# Patient Record
Sex: Female | Born: 1940
Health system: Southern US, Community
[De-identification: ages and names within clinical notes are randomized; demographics above are authoritative.]

## PROBLEM LIST (undated history)

## (undated) DIAGNOSIS — Z79890 Hormone replacement therapy (postmenopausal): Secondary | ICD-10-CM

## (undated) DIAGNOSIS — F419 Anxiety disorder, unspecified: Secondary | ICD-10-CM

## (undated) DIAGNOSIS — E785 Hyperlipidemia, unspecified: Secondary | ICD-10-CM

## (undated) DIAGNOSIS — M722 Plantar fascial fibromatosis: Secondary | ICD-10-CM

## (undated) DIAGNOSIS — I10 Essential (primary) hypertension: Secondary | ICD-10-CM

## (undated) HISTORY — PX: ABDOMINAL HYSTERECTOMY: SHX81

## (undated) HISTORY — PX: TONSILLECTOMY: SUR1361

## (undated) HISTORY — PX: CHOLECYSTECTOMY: SHX55

## (undated) HISTORY — PX: APPENDECTOMY: SHX54

## (undated) HISTORY — PX: KNEE SURGERY: SHX244

## (undated) HISTORY — PX: NECK SURGERY: SHX720

## (undated) HISTORY — PX: BACK SURGERY: SHX140

---

## 2013-11-29 ENCOUNTER — Emergency Department (HOSPITAL_BASED_OUTPATIENT_CLINIC_OR_DEPARTMENT_OTHER): Payer: Medicare Other

## 2013-11-29 ENCOUNTER — Encounter (HOSPITAL_BASED_OUTPATIENT_CLINIC_OR_DEPARTMENT_OTHER): Payer: Self-pay | Admitting: Emergency Medicine

## 2013-11-29 ENCOUNTER — Ambulatory Visit (HOSPITAL_BASED_OUTPATIENT_CLINIC_OR_DEPARTMENT_OTHER)
Admission: EM | Admit: 2013-11-29 | Discharge: 2013-11-29 | Disposition: A | Discharge status: Home / Self Care | Payer: Medicare Other | Attending: Emergency Medicine | Admitting: Emergency Medicine

## 2013-11-29 ENCOUNTER — Encounter: Payer: Self-pay | Admitting: Emergency Medicine

## 2013-11-29 ENCOUNTER — Emergency Department
Admission: EM | Admit: 2013-11-29 | Discharge: 2013-11-29 | Disposition: A | Discharge status: Still In Hospital | Payer: Medicare Other | Source: Home / Self Care | Attending: Emergency Medicine | Admitting: Emergency Medicine

## 2013-11-29 DIAGNOSIS — K5730 Diverticulosis of large intestine without perforation or abscess without bleeding: Secondary | ICD-10-CM | POA: Insufficient documentation

## 2013-11-29 DIAGNOSIS — R1031 Right lower quadrant pain: Secondary | ICD-10-CM | POA: Insufficient documentation

## 2013-11-29 DIAGNOSIS — R300 Dysuria: Secondary | ICD-10-CM

## 2013-11-29 DIAGNOSIS — R109 Unspecified abdominal pain: Secondary | ICD-10-CM

## 2013-11-29 DIAGNOSIS — I998 Other disorder of circulatory system: Secondary | ICD-10-CM | POA: Insufficient documentation

## 2013-11-29 DIAGNOSIS — I2510 Atherosclerotic heart disease of native coronary artery without angina pectoris: Secondary | ICD-10-CM | POA: Insufficient documentation

## 2013-11-29 DIAGNOSIS — R161 Splenomegaly, not elsewhere classified: Secondary | ICD-10-CM | POA: Insufficient documentation

## 2013-11-29 HISTORY — DX: Plantar fascial fibromatosis: M72.2

## 2013-11-29 HISTORY — DX: Hormone replacement therapy: Z79.890

## 2013-11-29 HISTORY — DX: Essential (primary) hypertension: I10

## 2013-11-29 HISTORY — DX: Hyperlipidemia, unspecified: E78.5

## 2013-11-29 HISTORY — DX: Anxiety disorder, unspecified: F41.9

## 2013-11-29 LAB — POCT URINALYSIS DIP (MANUAL ENTRY)
Bilirubin, UA: NEGATIVE
Glucose, UA: NEGATIVE
Ketones, POC UA: NEGATIVE
Nitrite, UA: NEGATIVE
Protein Ur, POC: NEGATIVE
Spec Grav, UA: 1.01 (ref 1.005–1.03)
Urobilinogen, UA: 0.2 (ref 0–1)
pH, UA: 5.5 (ref 5–8)

## 2013-11-29 MED ORDER — CIPROFLOXACIN HCL 500 MG PO TABS: 500.0000 mg | ORAL_TABLET | Freq: Two times a day (BID) | ORAL | Status: DC

## 2013-11-29 MED ORDER — METRONIDAZOLE 500 MG PO TABS: 500.0000 mg | ORAL_TABLET | Freq: Three times a day (TID) | ORAL | Status: DC

## 2013-11-29 MED ORDER — CEFTRIAXONE SODIUM 1 G IJ SOLR
1.0000 g | Freq: Once | INTRAMUSCULAR | Status: AC
  Administered 2013-11-29: 1 g via INTRAMUSCULAR

## 2013-11-29 NOTE — ED Notes (Signed)
Patient went to NP 8 days ago and was tested for UTI>negative and no meds. Today awoke with dysuria, hematuria and frequency. No OTCs.

## 2013-11-29 NOTE — ED Notes (Signed)
Right side abd pain x 1 week. Was seen at Urgent care today and sent here for CT scan and labs

## 2013-11-29 NOTE — ED Provider Notes (Addendum)
CSN: [id number]     Arrival date & time 11/29/13  1333 History   First MD Initiated Contact with Patient 11/29/13 1456     Chief Complaint  Patient presents with  . Dysuria  . Hematuria  . Urinary Frequency    HPI  DYSURIA Onset:  7-10 days  Description: lower abdominal pain, dysuria, hematuria  Modifying factors: Was seen for abd pain 1 week ago. Had negative UA at the time. KUB showed constipation. Feels like constipation has resolved. Hx/o abdominal surgeries in the past. Pain was 10/10 last night.   Symptoms Urgency:  no Frequency: no  Hesitancy:  yes Hematuria:  yes Flank Pain:  no Fever: no Nausea/Vomiting:  no Missed LMP: no STD exposure: no Discharge: no Irritants: no Rash: no  Red Flags   More than 3 UTI's last 12 months:  no PMH of  Diabetes or Immunosuppression:  no Renal Disease/Calculi: yes; remote past  Urinary Tract Abnormality:  no Instrumentation or Trauma: no    Past Medical History  Diagnosis Date  . Hypertension   . Hormone replacement therapy   . Hyperlipidemia   . Anxiety   . Plantar fascia syndrome     left heel   Past Surgical History  Procedure Laterality Date  . Appendectomy    . Tonsillectomy    . Cholecystectomy    . Abdominal hysterectomy    . Knee surgery Right   . Back surgery    . Neck surgery     No family history on file. History  Substance Use Topics  . Smoking status: Never Smoker   . Smokeless tobacco: Not on file  . Alcohol Use: Yes   OB History   Grav Para Term Preterm Abortions TAB SAB Ect Mult Living                 Review of Systems  All other systems reviewed and are negative.    Allergies  Review of patient's allergies indicates no known allergies.  Home Medications   Current Outpatient Rx  Name  Route  Sig  Dispense  Refill  . citalopram (CELEXA) 20 MG tablet   Oral   Take 20 mg by mouth daily.         Marland Kitchen estradiol (CLIMARA - DOSED IN MG/24 HR) 0.0375 mg/24hr patch   Transdermal  Place 0.0375 mg onto the skin once a week.         . ezetimibe (ZETIA) 10 MG tablet   Oral   Take 10 mg by mouth daily.         Marland Kitchen lisinopril (PRINIVIL,ZESTRIL) 20 MG tablet   Oral   Take 20 mg by mouth daily.          BP 127/71  Pulse 65  Temp(Src) 97.5 F (36.4 C) (Oral)  Resp 16  Ht 5\' 4"  (1.626 m)  Wt 174 lb (78.926 kg)  BMI 29.85 kg/m2  SpO2 100% Physical Exam  Constitutional: She appears well-developed and well-nourished.  HENT:  Head: Normocephalic and atraumatic.  Eyes: Conjunctivae are normal. Pupils are equal, round, and reactive to light.  Neck: Normal range of motion. Neck supple.  Cardiovascular: Normal rate and regular rhythm.   Pulmonary/Chest: Effort normal and breath sounds normal.  Abdominal: Soft.  + RLQ and LLQ TTP  + bowel sounds No flank pain      ED Course  Procedures (including critical care time) Labs Review Labs Reviewed  URINE CULTURE  COMPREHENSIVE METABOLIC PANEL  POCT URINALYSIS  DIP (MANUAL ENTRY)  POCT CBC W AUTO DIFF ([facility])   Imaging Review Ct Abdomen Pelvis Wo Contrast  11/29/2013   CLINICAL DATA:  Right lower quadrant abdominal pain and pelvic pain. Constipation. Hematuria. Surgical history includes appendectomy, cholecystectomy and hysterectomy.  EXAM: CT ABDOMEN AND PELVIS WITHOUT CONTRAST  TECHNIQUE: Multidetector CT imaging of the abdomen and pelvis was performed following the standard protocol without intravenous contrast.  COMPARISON:  Report of CT abdomen and pelvis from [facility] 03/17/2013, interpreted as no acute findings, mild colonic diverticulosis.  FINDINGS: Metallic beam hardening streak artifact from the patient's fusion hardware from T10 through S1.  Normal-appearing stomach. Wall thickening involving a several cm segment of the distal and terminal ileum without evidence of obstruction. No edema or inflammation surrounding the thick-walled distal and terminal ileum. Remainder of  the small bowel wall normal in appearance. Scattered sigmoid colon diverticula without evidence acute diverticulitis. Appendix surgically absent. No ascites.  Calcified granulomata involving the liver and spleen. Borderline splenomegaly, the spleen measuring approximately 11.9 x 5.6 x 12.8 cm yielding a volume of approximately 425 ml. Gallbladder surgically absent. No biliary ductal dilation. Normal unenhanced appearance of the pancreas, adrenal glands, and kidneys. Severe aortoiliofemoral atherosclerosis without aneurysm. No significant lymphadenopathy.  Uterus surgically absent. No adnexal masses or free pelvic fluid. Numerous pelvic phleboliths. Urinary bladder unremarkable.  Bone window images demonstrate the surgical hardware from T10 through S1. Visualized lung bases clear. Heart size normal with 3 vessel coronary atherosclerosis.  IMPRESSION: 1. Wall thickening involving a several cm segment of the distal and terminal ileum, consistent with ileitis. No evidence of bowel obstruction. In the absence of surrounding edema/inflammation, this may be a chronic finding. 2. Sigmoid colon diverticulosis without evidence of acute diverticulitis. 3. Borderline splenomegaly.   Electronically Signed   By: Hulan Saas M.D.   On: 11/29/2013 19:01    EKG Interpretation    Date/Time:    Ventricular Rate:    PR Interval:    QRS Duration:   QT Interval:    QTC Calculation:   R Axis:     Text Interpretation:              MDM   1. Abdominal  pain, other specified site   2. Dysuria    Fairly broad ddx for sxs including UTI, nephrolithiasis, bowel obstruction ( hx/o multiple abd surgeries), colonic pathology.  Will send pt for CT abd and pel w/ IV contrast to better assess anatomy CBC and CMET pending ( will have to obtain from imaging site).  Will treat for UTI w/ rocephin x1 pending CT scan.  Urine culture.  Overall well appearing currently.  Follow up pending CT scan.     Doree Albee,  MD 11/29/13 1600  Clinical update:  Noted ileitis, diverticulosis w/o diverticulitis, splenomegaly on imaging.  Discussed findings with pt over the phone.  Will place pt on course of cipro and flagyl.  Will need bloodwork drawn in am( was unable to be drawn at [facility] without pt being seen in ER-pt refused ER visit).  Discussed need for GI and PCP follow up early next week.  Discussed infectious and GI/GU red flags.  Follow up as needed.      Doree Albee, MD 11/29/13 [phone]

## 2013-11-29 NOTE — ED Notes (Addendum)
Pt sent from urgent care for CT scan with contrast.  Pt would need labwork for testing.  Radiology called ordering MD regarding pt.  Dr. Loralee Pacas called per RT and was told that the patient could have the CT without contrast and would not have to encounter an ED visit.

## 2013-11-30 ENCOUNTER — Emergency Department
Admission: EM | Admit: 2013-11-30 | Discharge: 2013-11-30 | Disposition: A | Discharge status: Home / Self Care | Payer: Medicare Other | Source: Home / Self Care

## 2013-11-30 ENCOUNTER — Telehealth: Payer: Self-pay | Admitting: Emergency Medicine

## 2013-11-30 LAB — COMPREHENSIVE METABOLIC PANEL
ALT: 8 U/L (ref 0–35)
Albumin: 3.8 g/dL (ref 3.5–5.2)
Alkaline Phosphatase: 55 U/L (ref 39–117)
CO2: 28 mEq/L (ref 19–32)
Creat: 1.33 mg/dL — ABNORMAL HIGH (ref 0.50–1.10)
Glucose, Bld: 152 mg/dL — ABNORMAL HIGH (ref 70–99)
Sodium: 140 mEq/L (ref 135–145)
Total Bilirubin: 0.4 mg/dL (ref 0.3–1.2)
Total Protein: 6.1 g/dL (ref 6.0–8.3)

## 2013-11-30 LAB — CBC
Hemoglobin: 11.5 g/dL — ABNORMAL LOW (ref 12.0–15.0)
MCH: 28 pg (ref 26.0–34.0)
MCHC: 34.5 g/dL (ref 30.0–36.0)
MCV: 81 fL (ref 78.0–100.0)
Platelets: 271 10*3/uL (ref 150–400)
RBC: 4.11 MIL/uL (ref 3.87–5.11)
RDW: 13.9 % (ref 11.5–15.5)

## 2013-12-01 LAB — URINE CULTURE
Colony Count: NO GROWTH
Organism ID, Bacteria: NO GROWTH

## 2013-12-04 ENCOUNTER — Telehealth: Payer: Self-pay | Admitting: *Deleted

## 2014-07-13 IMAGING — CT CT ABD-PELV W/O CM
2 of 4 series · 16 of 46 positions shown, 18 images · non-contrast
Comparison: Report of CT abdomen and pelvis from [REDACTED] 03/17/2013, interpreted as no acute findings, mild
colonic diverticulosis.

CLINICAL DATA: Right lower quadrant abdominal pain and pelvic pain.
Constipation. Hematuria. Surgical history includes appendectomy,
cholecystectomy and hysterectomy.

EXAM:
CT ABDOMEN AND PELVIS WITHOUT CONTRAST
TECHNIQUE: Multidetector CT imaging of the abdomen and pelvis was performed
following the standard protocol without intravenous contrast.

[Series 3: abd/pelvis 5.0 b31f · axial · 0.78mm/px · z∈[-482,-52]mm · 13 of 94 slices shown, 15 images]
[im 4/94  soft-tissue]
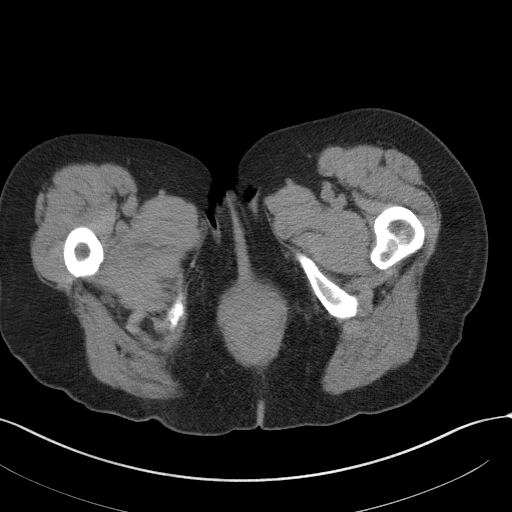
[im 4/94  bone]
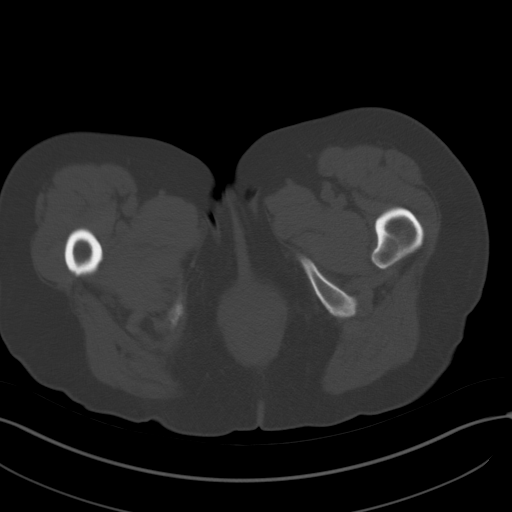
[im 12/94  soft-tissue]
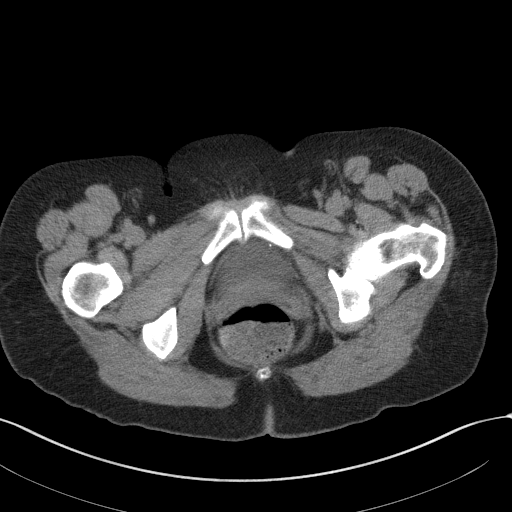
[im 19/94  soft-tissue]
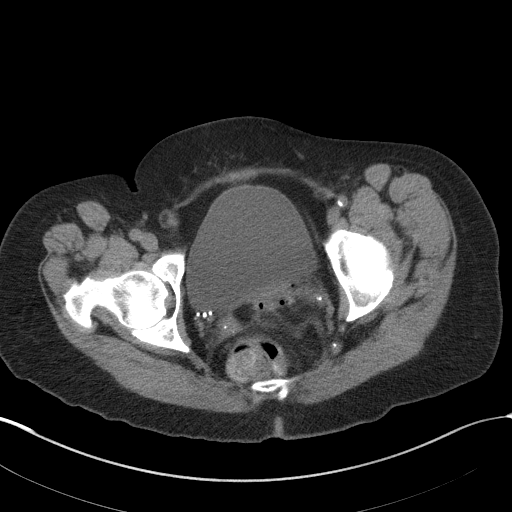
[im 27/94  soft-tissue]
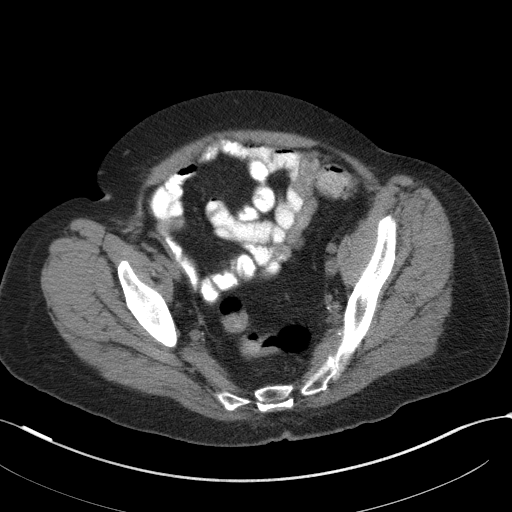
[im 34/94  soft-tissue]
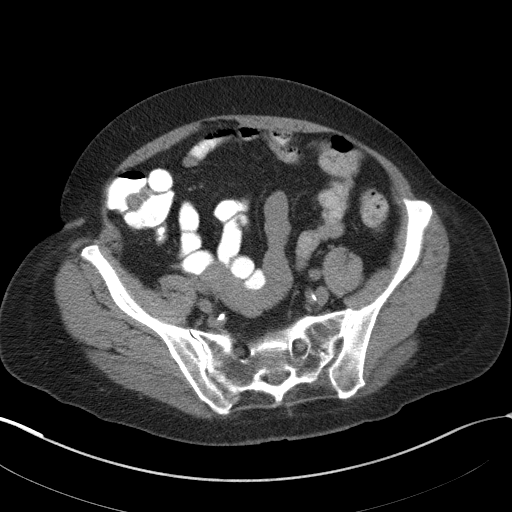
[im 41/94  soft-tissue]
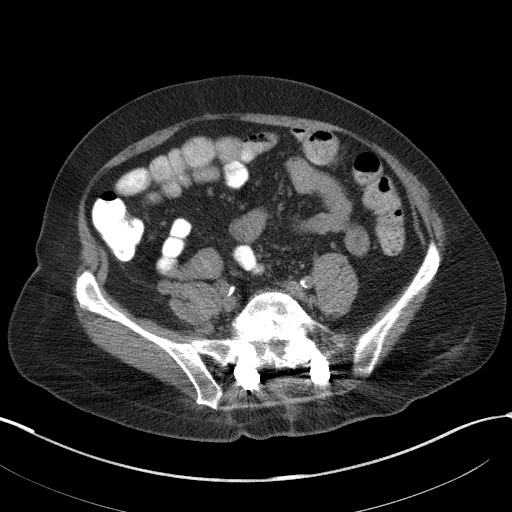
[im 49/94  soft-tissue]
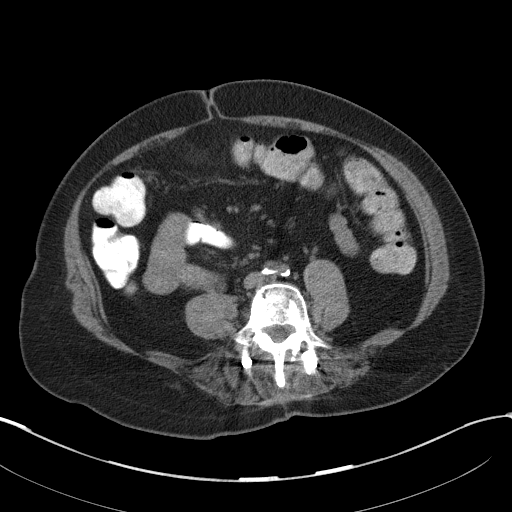
[im 53/94  soft-tissue]
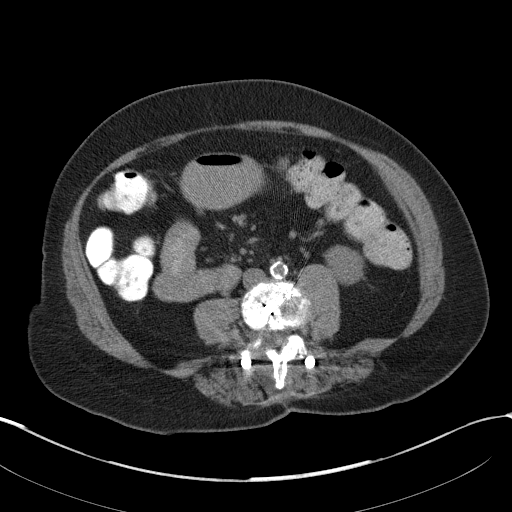
[im 60/94  soft-tissue]
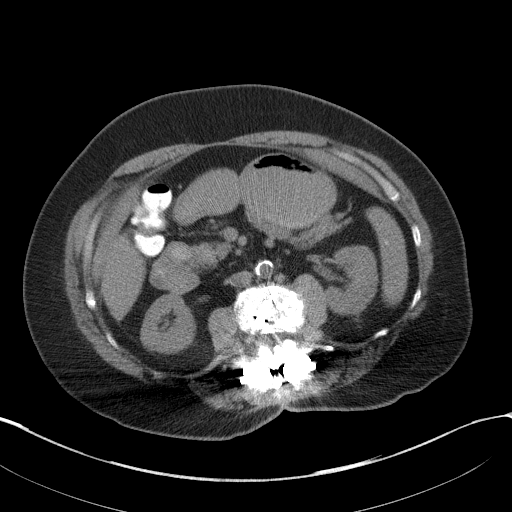
[im 60/94  bone]
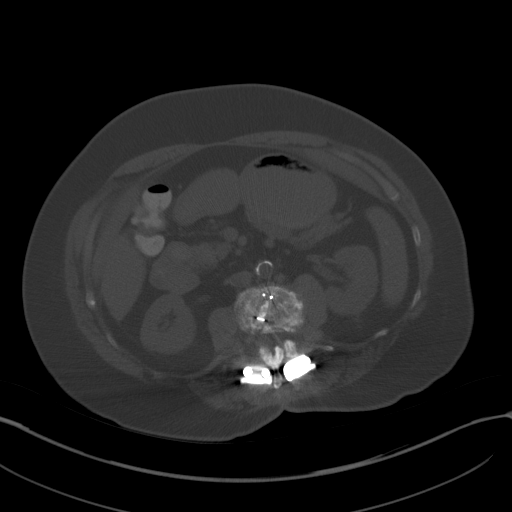
[im 67/94  soft-tissue]
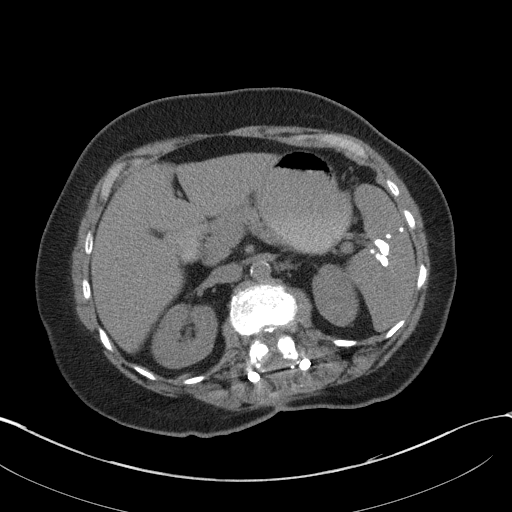
[im 75/94  soft-tissue]
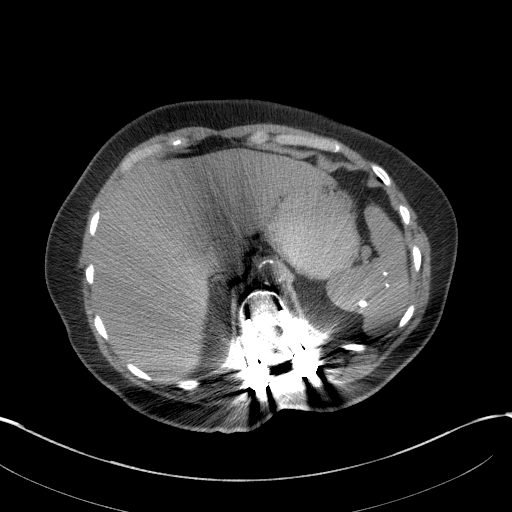
[im 82/94  soft-tissue]
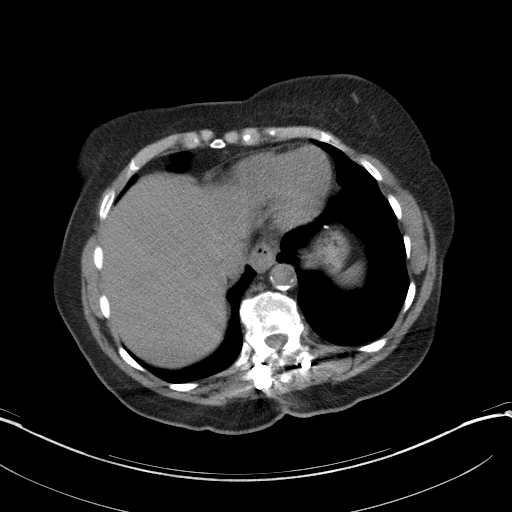
[im 90/94  soft-tissue]
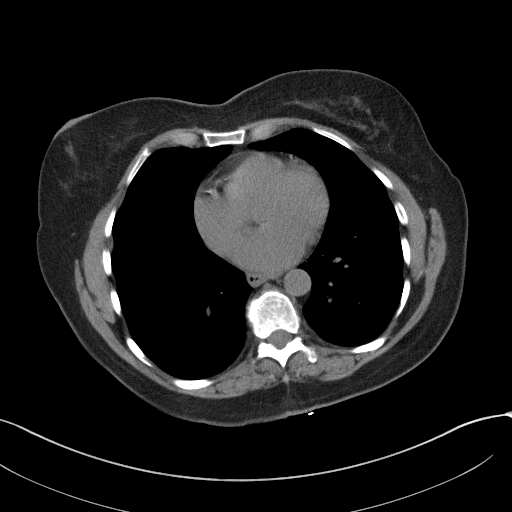

[Series 6: abd/pelvis 3.0 coronal · coronal · 0.81mm/px · 3 of 80 slices shown]
[im 27/80  soft-tissue]
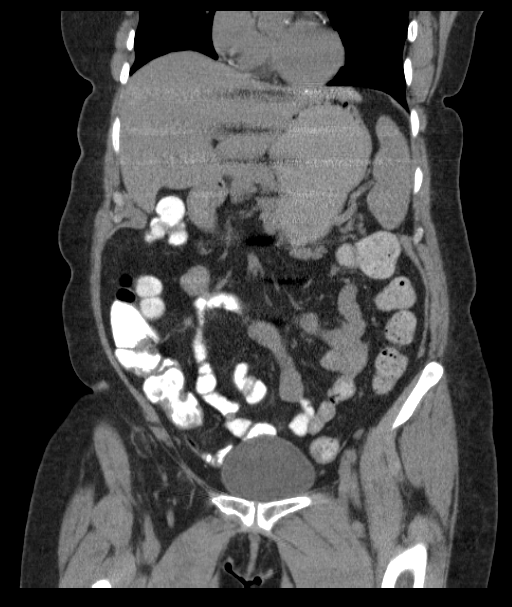
[im 36/80  soft-tissue]
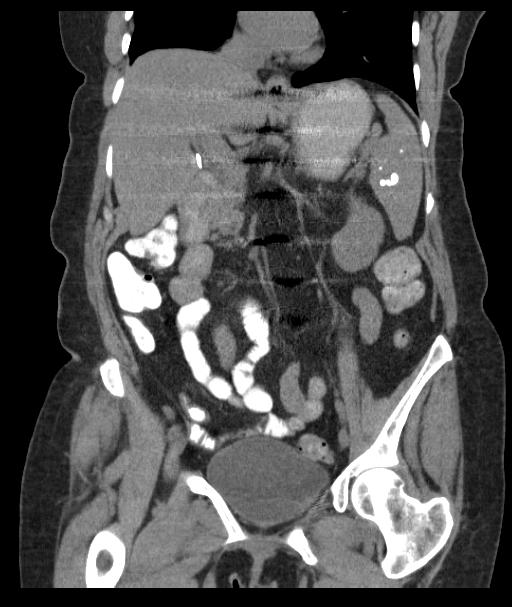
[im 44/80  soft-tissue]
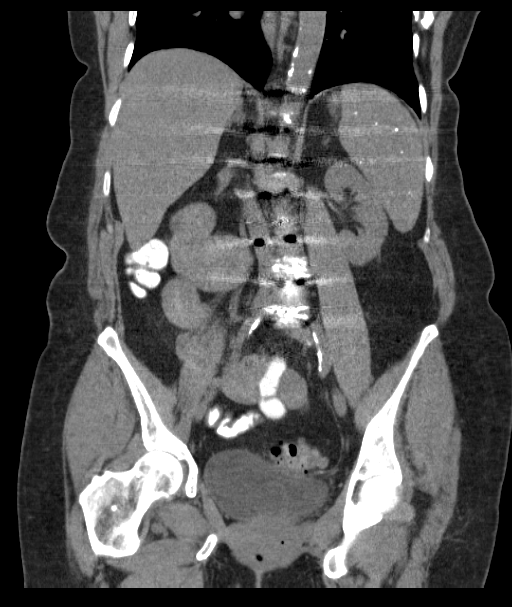

[16 of 46 positions shown; findings below may reference images not displayed]

FINDINGS: Metallic beam hardening streak artifact from the patient's fusion
hardware from T10 through S1.

Normal-appearing stomach. Wall thickening involving a several cm
segment of the distal and terminal ileum without evidence of
obstruction. No edema or inflammation surrounding the thick-walled
distal and terminal ileum. Remainder of the small bowel wall normal
in appearance. Scattered sigmoid colon diverticula without evidence
acute diverticulitis. Appendix surgically absent. No ascites.

Calcified granulomata involving the liver and spleen. Borderline
splenomegaly, the spleen measuring approximately 11.9 x 5.6 x
cm yielding a volume of approximately 425 ml. Gallbladder surgically
absent. No biliary ductal dilation. Normal unenhanced appearance of
the pancreas, adrenal glands, and kidneys. Severe aortoiliofemoral
atherosclerosis without aneurysm. No significant lymphadenopathy.

Uterus surgically absent. No adnexal masses or free pelvic fluid.
Numerous pelvic phleboliths. Urinary bladder unremarkable.

Bone window images demonstrate the surgical hardware from T10
through S1. Visualized lung bases clear. Heart size normal with 3
vessel coronary atherosclerosis.
IMPRESSION: 1. Wall thickening involving a several cm segment of the distal and
terminal ileum, consistent with ileitis. No evidence of bowel
obstruction. In the absence of surrounding edema/inflammation, this
may be a chronic finding.
2. Sigmoid colon diverticulosis without evidence of acute
diverticulitis.
3. Borderline splenomegaly.

## 2016-11-21 ENCOUNTER — Emergency Department
Admission: EM | Admit: 2016-11-21 | Discharge: 2016-11-21 | Disposition: A | Discharge status: Home / Self Care | Payer: Medicare HMO | Source: Home / Self Care | Attending: Family Medicine | Admitting: Family Medicine

## 2016-11-21 DIAGNOSIS — B9789 Other viral agents as the cause of diseases classified elsewhere: Secondary | ICD-10-CM | POA: Diagnosis not present

## 2016-11-21 DIAGNOSIS — J069 Acute upper respiratory infection, unspecified: Secondary | ICD-10-CM

## 2016-11-21 DIAGNOSIS — J9801 Acute bronchospasm: Secondary | ICD-10-CM

## 2016-11-21 MED ORDER — GUAIFENESIN-CODEINE 100-10 MG/5ML PO SOLN: ORAL | 0 refills | Status: DC

## 2016-11-21 MED ORDER — PREDNISONE 20 MG PO TABS: ORAL_TABLET | ORAL | 0 refills | Status: DC

## 2016-11-21 MED ORDER — METHYLPREDNISOLONE SODIUM SUCC 125 MG IJ SOLR
80.0000 mg | Freq: Once | INTRAMUSCULAR | Status: AC
  Administered 2016-11-21: 80 mg via INTRAMUSCULAR

## 2016-11-21 MED ORDER — ALBUTEROL SULFATE HFA 108 (90 BASE) MCG/ACT IN AERS: 2.0000 | INHALATION_SPRAY | RESPIRATORY_TRACT | 0 refills | Status: DC | PRN

## 2016-11-21 MED ORDER — CEPHALEXIN 500 MG PO CAPS: 500.0000 mg | ORAL_CAPSULE | Freq: Three times a day (TID) | ORAL | 0 refills | Status: DC

## 2016-11-21 NOTE — ED Provider Notes (Signed)
[facility]    CSN: [id number] Arrival date & time: 11/21/16  0900     History   Chief Complaint Chief Complaint  Patient presents with  . Cough  . Headache    HPI Sandra LoserSarah Newman is a 75 y.o. female.   Patient complains of four day history of typical cold-like symptoms developing over several days, including mild sore throat, sinus congestion, headache, fatigue, myalgias, and non-productive cough.  She has a history of seasonal rhinitis, and notes that URI's tend to linger.  She has a family history of asthma (sister and paternal grandfather).   The history is provided by the patient.    Past Medical History:  Diagnosis Date  . Anxiety   . Hormone replacement therapy   . Hyperlipidemia   . Hyperlipidemia   . Hypertension   . Plantar fascia syndrome    left heel    There are no active problems to display for this patient.   Past Surgical History:  Procedure Laterality Date  . ABDOMINAL HYSTERECTOMY    . APPENDECTOMY    . BACK SURGERY    . CHOLECYSTECTOMY    . KNEE SURGERY Right   . NECK SURGERY    . TONSILLECTOMY      OB History    No data available       Home Medications    Prior to Admission medications   Medication Sig Start Date End Date Taking? Authorizing Provider  albuterol (PROVENTIL HFA;VENTOLIN HFA) 108 (90 Base) MCG/ACT inhaler Inhale 2 puffs into the lungs every 4 (four) hours as needed for wheezing or shortness of breath. 11/21/16   Lattie HawStephen A Beese, MD  cephALEXin (KEFLEX) 500 MG capsule Take 1 capsule (500 mg total) by mouth 3 (three) times daily. (Rx void after 11/29/16) 11/21/16   Lattie HawStephen A Beese, MD  ciprofloxacin (CIPRO) 500 MG tablet Take 1 tablet (500 mg total) by mouth 2 (two) times daily. 11/29/13   Floydene FlockSteven J Newton, MD  citalopram (CELEXA) 20 MG tablet Take 20 mg by mouth daily.    Historical Provider, MD  estradiol (CLIMARA - DOSED IN MG/24 HR) 0.0375 mg/24hr patch Place 0.0375 mg onto the skin once a week.     Historical Provider, MD  ezetimibe (ZETIA) 10 MG tablet Take 10 mg by mouth daily.    Historical Provider, MD  guaiFENesin-codeine 100-10 MG/5ML syrup Take 10mL by mouth at bedtime as needed for cough 11/21/16   Lattie HawStephen A Beese, MD  lisinopril (PRINIVIL,ZESTRIL) 20 MG tablet Take 20 mg by mouth daily.    Historical Provider, MD  metroNIDAZOLE (FLAGYL) 500 MG tablet Take 1 tablet (500 mg total) by mouth 3 (three) times daily. 11/29/13   Floydene FlockSteven J Newton, MD  predniSONE (DELTASONE) 20 MG tablet Take one tab by mouth twice daily for 5 days, then one daily for 3 days. Take with food. 11/21/16   Lattie HawStephen A Beese, MD    Family History Family History  Problem Relation Age of Onset  . Cancer Mother   . Hypertension Father     Social History Social History  Substance Use Topics  . Smoking status: Never Smoker  . Smokeless tobacco: Never Used  . Alcohol use Yes     Comment: seldom     Allergies   Patient has no known allergies.   Review of Systems Review of Systems  + sore throat + cough No pleuritic pain No wheezing + nasal congestion + post-nasal drainage No sinus pain/pressure No itchy/red eyes No  earache No hemoptysis + SOB No fever, + chills No nausea No vomiting No abdominal pain + diarrhea, resolved No urinary symptoms No skin rash + fatigue + myalgias + headache Used OTC meds without relief    Physical Exam Triage Vital Signs ED Triage Vitals  Enc Vitals Group     BP 11/21/16 0922 153/81     Pulse Rate 11/21/16 0922 100     Resp --      Temp 11/21/16 0922 97.8 F (36.6 C)     Temp Source 11/21/16 0922 Oral     SpO2 11/21/16 0922 93 %     Weight 11/21/16 0922 174 lb (78.9 kg)     Height 11/21/16 0922 5\' 5"  (1.651 m)     Head Circumference --      Peak Flow --      Pain Score 11/21/16 0925 4     Pain Loc --      Pain Edu? --      Excl. in GC? --    No data found.   Updated Vital Signs BP 153/81 (BP Location: Left Arm)   Pulse 100   Temp 97.8  F (36.6 C) (Oral)   Ht 5\' 5"  (1.651 m)   Wt 174 lb (78.9 kg)   SpO2 93%   BMI 28.96 kg/m   Visual Acuity Right Eye Distance:   Left Eye Distance:   Bilateral Distance:    Right Eye Near:   Left Eye Near:    Bilateral Near:     Physical Exam Nursing notes and Vital Signs reviewed. Appearance:  Patient appears stated age, and in no acute distress Eyes:  Pupils are equal, round, and reactive to light and accomodation.  Extraocular movement is intact.  Conjunctivae are not inflamed  Ears:  Canals normal.  Tympanic membranes normal.  Nose:  Congested turbinates.  No sinus tenderness.    Pharynx:  Normal Neck:  Supple.  Tender enlarged posterior/lateral nodes are palpated bilaterally  Lungs:  Clear to auscultation.  Breath sounds are equal.  Moving air well. Heart:  Regular rate and rhythm without murmurs, rubs, or gallops.  Abdomen:  Nontender without masses or hepatosplenomegaly.  Bowel sounds are present.  No CVA or flank tenderness.  Extremities:  No edema.  Skin:  No rash present.    UC Treatments / Results  Labs (all labs ordered are listed, but only abnormal results are displayed) Labs Reviewed - No data to display  EKG  EKG Interpretation None       Radiology No results found.  Procedures Procedures (including critical care time)  Medications Ordered in UC Medications  methylPREDNISolone sodium succinate (SOLU-MEDROL) 125 mg/2 mL injection 80 mg (80 mg Intramuscular Given 11/21/16 0947)     Initial Impression / Assessment and Plan / UC Course  I have reviewed the triage vital signs and the nursing notes.  Pertinent labs & imaging results that were available during my care of the patient were reviewed by me and considered in my medical decision making (see chart for details).  Clinical Course   Reassurance:  there is no evidence of bacterial infection today.   With patient's history of seasonal rhinitis, family history of asthma, and past history of  viral URI's that linger, she probably has a predisposition to mild reactive airways disease.  Administered Solumedrol 80mg  IM Rx for Robitussin AC for night time cough.  Refill albuterol MDI. Begin prednisone burst/taper Wednesday 11/22/16. Take plain guaifenesin (1200mg  extended release tabs such  as Mucinex) twice daily, with plenty of water, for cough and congestion.  Get adequate rest.   May use Afrin nasal spray (or generic oxymetazoline) twice daily for about 5 days and then discontinue.  Also recommend using saline nasal spray several times daily and saline nasal irrigation (AYR is a common brand).  Use Flonase nasal spray each morning after using Afrin nasal spray and saline nasal irrigation. Try warm salt water gargles for sore throat.  Stop all antihistamines for now, and other non-prescription cough/cold preparations. Continue albuterol inhaler as needed. Begin Cephalexin if not improving about one week or if persistent fever develops (Given a prescription to hold, with an expiration date)  Follow-up with family doctor if not improving about10 days.      Final Clinical Impressions(s) / UC Diagnoses   Final diagnoses:  Viral URI with cough  Bronchospasm    New Prescriptions New Prescriptions   ALBUTEROL (PROVENTIL HFA;VENTOLIN HFA) 108 (90 BASE) MCG/ACT INHALER    Inhale 2 puffs into the lungs every 4 (four) hours as needed for wheezing or shortness of breath.   CEPHALEXIN (KEFLEX) 500 MG CAPSULE    Take 1 capsule (500 mg total) by mouth 3 (three) times daily. (Rx void after 11/29/16)   GUAIFENESIN-CODEINE 100-10 MG/5ML SYRUP    Take 10mL by mouth at bedtime as needed for cough   PREDNISONE (DELTASONE) 20 MG TABLET    Take one tab by mouth twice daily for 5 days, then one daily for 3 days. Take with food.     Lattie Haw, MD 12/03/16 [phone]

## 2016-11-21 NOTE — ED Triage Notes (Signed)
Pt started with coughing sneezing and headache Friday, has progressively become worse.

## 2016-11-21 NOTE — Discharge Instructions (Signed)
Begin prednisone Wednesday 11/22/16. Take plain guaifenesin (1200mg  extended release tabs such as Mucinex) twice daily, with plenty of water, for cough and congestion.  Get adequate rest.   May use Afrin nasal spray (or generic oxymetazoline) twice daily for about 5 days and then discontinue.  Also recommend using saline nasal spray several times daily and saline nasal irrigation (AYR is a common brand).  Use Flonase nasal spray each morning after using Afrin nasal spray and saline nasal irrigation. Try warm salt water gargles for sore throat.  Stop all antihistamines for now, and other non-prescription cough/cold preparations. Continue albuterol inhaler as needed. Begin Cephalexin if not improving about one week or if persistent fever develops   Follow-up with family doctor if not improving about10 days.

## 2017-04-19 ENCOUNTER — Ambulatory Visit (INDEPENDENT_AMBULATORY_CARE_PROVIDER_SITE_OTHER): Payer: Medicare HMO | Admitting: Family Medicine

## 2017-04-19 ENCOUNTER — Encounter: Payer: Self-pay | Admitting: *Deleted

## 2017-04-19 ENCOUNTER — Emergency Department
Admission: EM | Admit: 2017-04-19 | Discharge: 2017-04-19 | Disposition: A | Discharge status: Home / Self Care | Payer: Medicare HMO | Source: Home / Self Care | Attending: Family Medicine | Admitting: Family Medicine

## 2017-04-19 DIAGNOSIS — M25561 Pain in right knee: Secondary | ICD-10-CM

## 2017-04-19 DIAGNOSIS — G8929 Other chronic pain: Secondary | ICD-10-CM

## 2017-04-19 NOTE — Progress Notes (Signed)
Sent!

## 2017-04-19 NOTE — ED Provider Notes (Signed)
[facility]    CSN: [id number] Arrival date & time: 04/19/17  1350     History   Chief Complaint Chief Complaint  Patient presents with  . Knee Pain    HPI Sandra LoserSarah Newman is a 76 y.o. female.   Patient complains of increased right knee pain for several months.  She has a history of DJD, having had several joint injections in the past from her sports medicine physician Dr. Louis MatteKenneth Lennon.  Her last injection was approximately one month ago, and he cannot see her again until July.  She is leaving on a trip to [place] in five days and would like a repeat injection if possible.  No recent fevers, chills, and sweats.    Knee Pain  Location:  Knee Time since incident:  3 months Injury: no   Knee location:  R knee Pain details:    Quality:  Aching and dull   Radiates to:  Does not radiate   Severity:  Moderate   Onset quality:  Gradual   Duration:  3 months   Timing:  Constant   Progression:  Worsening Chronicity:  Chronic Dislocation: no   Relieved by:  Nothing Worsened by:  Bearing weight, activity and exercise Ineffective treatments:  NSAIDs Associated symptoms: decreased ROM, stiffness and swelling   Associated symptoms: no fatigue, no fever, no muscle weakness and no numbness     Past Medical History:  Diagnosis Date  . Anxiety   . Hormone replacement therapy   . Hyperlipidemia   . Hyperlipidemia   . Hypertension   . Plantar fascia syndrome    left heel    There are no active problems to display for this patient.   Past Surgical History:  Procedure Laterality Date  . ABDOMINAL HYSTERECTOMY    . APPENDECTOMY    . BACK SURGERY    . CHOLECYSTECTOMY    . KNEE SURGERY Right   . NECK SURGERY    . TONSILLECTOMY      OB History    No data available       Home Medications    Prior to Admission medications   Medication Sig Start Date End Date Taking? Authorizing Provider  citalopram (CELEXA) 20 MG tablet Take 20 mg by mouth daily.    Yes [provider]  estradiol (CLIMARA - DOSED IN MG/24 HR) 0.0375 mg/24hr patch Place 0.0375 mg onto the skin once a week.   Yes [provider]  lisinopril (PRINIVIL,ZESTRIL) 20 MG tablet Take 20 mg by mouth daily.   Yes [provider]  rosuvastatin (CRESTOR) 10 MG tablet Take 10 mg by mouth daily.   Yes [provider]  ezetimibe (ZETIA) 10 MG tablet Take 10 mg by mouth daily.    [provider]    Family History Family History  Problem Relation Age of Onset  . Cancer Mother   . Hypertension Father     Social History Social History  Substance Use Topics  . Smoking status: Never Smoker  . Smokeless tobacco: Never Used  . Alcohol use Yes     Comment: seldom     Allergies   Patient has no known allergies.   Review of Systems Review of Systems  Constitutional: Negative for fatigue and fever.  Musculoskeletal: Positive for stiffness.  All other systems reviewed and are negative.    Physical Exam Triage Vital Signs ED Triage Vitals  Enc Vitals Group     BP 04/19/17 1416 (!) 159/79  Pulse Rate 04/19/17 1416 77     Resp --      Temp --      Temp src --      SpO2 04/19/17 1416 98 %     Weight 04/19/17 1418 174 lb (78.9 kg)     Height --      Head Circumference --      Peak Flow --      Pain Score 04/19/17 1418 8     Pain Loc --      Pain Edu? --      Excl. in GC? --    No data found.   Updated Vital Signs BP (!) 159/79 (BP Location: Left Arm)   Pulse 77   Wt 174 lb (78.9 kg)   SpO2 98%   BMI 28.96 kg/m   Visual Acuity Right Eye Distance:   Left Eye Distance:   Bilateral Distance:    Right Eye Near:   Left Eye Near:    Bilateral Near:     Physical Exam  Constitutional: She appears well-developed and well-nourished. No distress.  HENT:  Head: Normocephalic.  Mouth/Throat: Oropharynx is clear and moist.  Eyes: Pupils are equal, round, and reactive to light.  Cardiovascular: Normal rate.     Pulmonary/Chest: Effort normal.  Musculoskeletal:       Right knee: She exhibits decreased range of motion and effusion. Tenderness found. Medial joint line and lateral joint line tenderness noted.  Right knee:   Relatively good range of motion.  Effusion present.  No erythema or warmth.  Knee stable, negative drawer test.   Tenderness along medial and lateral joint lines.  Lymphadenopathy:    She has no cervical adenopathy.  Neurological: She is alert.  Skin: Skin is warm and dry.  Nursing note and vitals reviewed.    UC Treatments / Results  Labs (all labs ordered are listed, but only abnormal results are displayed) Labs Reviewed - No data to display  EKG  EKG Interpretation None       Radiology No results found.  Procedures Procedures (including critical care time)  Medications Ordered in UC Medications - No data to display   Initial Impression / Assessment and Plan / UC Course  I have reviewed the triage vital signs and the nursing notes.  Pertinent labs & imaging results that were available during my care of the patient were reviewed by me and considered in my medical decision making (see chart for details).    Patient referred today to Dr. Clementeen Graham for joint injection. Call if increased pain, swelling, redness, fever, etc develop. Followup with Dr. Clementeen Graham ([facility]) as needed.    Final Clinical Impressions(s) / UC Diagnoses   Final diagnoses:  Acute pain of right knee    New Prescriptions New Prescriptions   No medications on file     Lattie Haw, MD 04/30/17 [phone]

## 2017-04-19 NOTE — Discharge Instructions (Signed)
Call if increased pain, swelling, redness, fever, etc. develop

## 2017-04-19 NOTE — Progress Notes (Signed)
   Subjective:    I'm seeing this patient as a consultation for:  Dr Cathren HarshBeese  CC: Right knee pain  HPI: Patient has a history of right knee pain due to DJD. She has been receiving care via internal medicine and orthopedic surgery for knee pain. She's had several injections previously most recently a steroid injection in March 2017 which worked for about a month. In the past she's had a trial of sounds like viscous supplementation injection which worked for a little less than 6 months. She knows that she'll probably need to have a knee replacement but would like to get through the summer of possible. [place] is leaving for [place] in the next few days as spent time with family their and would like to not have knee pain during that trip. She would like a repeat injection if possible. She denies any fevers or chills or intolerance to steroid injection medications.  Past medical history, Surgical history, Family history not pertinant except as noted below, Social history, Allergies, and medications have been entered into the medical record, reviewed, and no changes needed.   Review of Systems: No headache, visual changes, nausea, vomiting, diarrhea, constipation, dizziness, abdominal pain, skin rash, fevers, chills, night sweats, weight loss, swollen lymph nodes, body aches, joint swelling, muscle aches, chest pain, shortness of breath, mood changes, visual or auditory hallucinations.   Objective:   There were no vitals filed for this visit. General: Well Developed, well nourished, and in no acute distress.  Neuro/Psych: Alert and oriented x3, extra-ocular muscles intact, able to move all 4 extremities, sensation grossly intact. Skin: Warm and dry, no rashes noted.  Respiratory: Not using accessory muscles, speaking in full sentences, trachea midline.  Cardiovascular: Pulses palpable, no extremity edema. Abdomen: Does not appear distended. MSK: Right knee moderate effusion. Otherwise  normal-appearing Motion 0-120 with retropatellar crepitations. Tender along the medial and lateral joint lines. Stable ligamentous exam. Intact flexion and extension strength.  Procedure: Real-time Ultrasound Guided Injection of right knee  Device: GE Logiq E  Images permanently stored and available for review in the ultrasound unit. Verbal informed consent obtained. Discussed risks and benefits of procedure. Warned about infection bleeding damage to structures skin hypopigmentation and fat atrophy among others. Patient expresses understanding and agreement Time-out conducted.  Noted no overlying erythema, induration, or other signs of local infection.  Skin prepped in a sterile fashion.  Local anesthesia: Topical Ethyl chloride.  With sterile technique and under real time ultrasound guidance: 80 mg of Kenalog and 3 mL of lidocaine injected easily.  Completed without difficulty  Pain immediately resolved suggesting accurate placement of the medication.  Advised to call if fevers/chills, erythema, induration, drainage, or persistent bleeding.  Images permanently stored and available for review in the ultrasound unit.  Impression: Technically successful ultrasound guided injection.    No results found for this or any previous visit (from the past 24 hour(s)). No results found.  Impression and Recommendations:    Assessment and Plan: 76 y.o. female with  Right knee pain due to DJD. Plan for repeat steroid injection today as a conservative measure. Advised patient and likely she'll need a knee replacement sooner rather than later. Follow-up with orthopedics or return to urgent care as needed..  Discussed warning signs or symptoms. Please see discharge instructions. Patient expresses understanding.   CC: Margaree MackintoshMcKinney, John, MD

## 2017-04-19 NOTE — ED Triage Notes (Signed)
Patient c/o right knee pain for "several months" without injury. She has seen [facility] and had an injection in the past. He cannot see her again until July. She is going on a trip in 5 days. She would like something to help with the pain, possibly an injection. Taking Aleve otc.

## 2017-12-28 DIAGNOSIS — R05 Cough: Secondary | ICD-10-CM | POA: Diagnosis not present

## 2017-12-28 DIAGNOSIS — E1165 Type 2 diabetes mellitus with hyperglycemia: Secondary | ICD-10-CM | POA: Diagnosis not present

## 2017-12-28 DIAGNOSIS — E559 Vitamin D deficiency, unspecified: Secondary | ICD-10-CM | POA: Diagnosis not present

## 2017-12-28 DIAGNOSIS — D539 Nutritional anemia, unspecified: Secondary | ICD-10-CM | POA: Diagnosis not present

## 2017-12-28 DIAGNOSIS — R5383 Other fatigue: Secondary | ICD-10-CM | POA: Diagnosis not present

## 2017-12-28 DIAGNOSIS — I10 Essential (primary) hypertension: Secondary | ICD-10-CM | POA: Diagnosis not present

## 2017-12-28 DIAGNOSIS — R300 Dysuria: Secondary | ICD-10-CM | POA: Diagnosis not present

## 2017-12-28 DIAGNOSIS — E7800 Pure hypercholesterolemia, unspecified: Secondary | ICD-10-CM | POA: Diagnosis not present

## 2017-12-28 DIAGNOSIS — M129 Arthropathy, unspecified: Secondary | ICD-10-CM | POA: Diagnosis not present

## 2018-01-15 DIAGNOSIS — R51 Headache: Secondary | ICD-10-CM | POA: Diagnosis not present

## 2018-01-15 DIAGNOSIS — R509 Fever, unspecified: Secondary | ICD-10-CM | POA: Diagnosis not present

## 2018-01-18 DIAGNOSIS — J209 Acute bronchitis, unspecified: Secondary | ICD-10-CM | POA: Diagnosis not present

## 2018-01-18 DIAGNOSIS — J111 Influenza due to unidentified influenza virus with other respiratory manifestations: Secondary | ICD-10-CM | POA: Diagnosis not present

## 2018-01-18 DIAGNOSIS — R0600 Dyspnea, unspecified: Secondary | ICD-10-CM | POA: Diagnosis not present

## 2018-01-18 DIAGNOSIS — R05 Cough: Secondary | ICD-10-CM | POA: Diagnosis not present

## 2018-01-23 DIAGNOSIS — M170 Bilateral primary osteoarthritis of knee: Secondary | ICD-10-CM | POA: Diagnosis not present

## 2018-01-24 DIAGNOSIS — R0600 Dyspnea, unspecified: Secondary | ICD-10-CM | POA: Diagnosis not present

## 2018-01-24 DIAGNOSIS — J101 Influenza due to other identified influenza virus with other respiratory manifestations: Secondary | ICD-10-CM | POA: Diagnosis not present

## 2018-01-24 DIAGNOSIS — R5383 Other fatigue: Secondary | ICD-10-CM | POA: Diagnosis not present

## 2018-02-04 DIAGNOSIS — I10 Essential (primary) hypertension: Secondary | ICD-10-CM | POA: Diagnosis not present

## 2018-02-04 DIAGNOSIS — E1165 Type 2 diabetes mellitus with hyperglycemia: Secondary | ICD-10-CM | POA: Diagnosis not present

## 2018-02-04 DIAGNOSIS — E559 Vitamin D deficiency, unspecified: Secondary | ICD-10-CM | POA: Diagnosis not present

## 2018-02-04 DIAGNOSIS — E7800 Pure hypercholesterolemia, unspecified: Secondary | ICD-10-CM | POA: Diagnosis not present

## 2018-02-19 DIAGNOSIS — J3089 Other allergic rhinitis: Secondary | ICD-10-CM | POA: Diagnosis not present

## 2018-02-19 DIAGNOSIS — R05 Cough: Secondary | ICD-10-CM | POA: Diagnosis not present

## 2018-03-11 DIAGNOSIS — E7800 Pure hypercholesterolemia, unspecified: Secondary | ICD-10-CM | POA: Diagnosis not present

## 2018-03-11 DIAGNOSIS — I10 Essential (primary) hypertension: Secondary | ICD-10-CM | POA: Diagnosis not present

## 2018-03-11 DIAGNOSIS — Z789 Other specified health status: Secondary | ICD-10-CM | POA: Diagnosis not present

## 2018-03-20 DIAGNOSIS — M1711 Unilateral primary osteoarthritis, right knee: Secondary | ICD-10-CM | POA: Diagnosis not present

## 2018-03-26 DIAGNOSIS — Z01818 Encounter for other preprocedural examination: Secondary | ICD-10-CM | POA: Diagnosis not present

## 2018-03-26 DIAGNOSIS — E559 Vitamin D deficiency, unspecified: Secondary | ICD-10-CM | POA: Diagnosis not present

## 2018-03-26 DIAGNOSIS — R7301 Impaired fasting glucose: Secondary | ICD-10-CM | POA: Diagnosis not present

## 2018-03-26 DIAGNOSIS — Z01810 Encounter for preprocedural cardiovascular examination: Secondary | ICD-10-CM | POA: Diagnosis not present

## 2018-03-26 DIAGNOSIS — N183 Chronic kidney disease, stage 3 (moderate): Secondary | ICD-10-CM | POA: Diagnosis not present

## 2018-03-26 DIAGNOSIS — M1711 Unilateral primary osteoarthritis, right knee: Secondary | ICD-10-CM | POA: Diagnosis not present

## 2018-03-26 DIAGNOSIS — I129 Hypertensive chronic kidney disease with stage 1 through stage 4 chronic kidney disease, or unspecified chronic kidney disease: Secondary | ICD-10-CM | POA: Diagnosis not present

## 2018-03-26 DIAGNOSIS — E785 Hyperlipidemia, unspecified: Secondary | ICD-10-CM | POA: Diagnosis not present

## 2018-03-28 DIAGNOSIS — E559 Vitamin D deficiency, unspecified: Secondary | ICD-10-CM | POA: Diagnosis not present

## 2018-03-28 DIAGNOSIS — E539 Vitamin B deficiency, unspecified: Secondary | ICD-10-CM | POA: Diagnosis not present

## 2018-03-28 DIAGNOSIS — D539 Nutritional anemia, unspecified: Secondary | ICD-10-CM | POA: Diagnosis not present

## 2018-03-28 DIAGNOSIS — E7800 Pure hypercholesterolemia, unspecified: Secondary | ICD-10-CM | POA: Diagnosis not present

## 2018-03-28 DIAGNOSIS — I10 Essential (primary) hypertension: Secondary | ICD-10-CM | POA: Diagnosis not present

## 2018-03-28 DIAGNOSIS — Z79899 Other long term (current) drug therapy: Secondary | ICD-10-CM | POA: Diagnosis not present

## 2018-03-28 DIAGNOSIS — J3089 Other allergic rhinitis: Secondary | ICD-10-CM | POA: Diagnosis not present

## 2018-03-28 DIAGNOSIS — M129 Arthropathy, unspecified: Secondary | ICD-10-CM | POA: Diagnosis not present

## 2018-03-28 DIAGNOSIS — R5383 Other fatigue: Secondary | ICD-10-CM | POA: Diagnosis not present

## 2018-04-02 DIAGNOSIS — R42 Dizziness and giddiness: Secondary | ICD-10-CM | POA: Diagnosis not present

## 2018-04-05 DIAGNOSIS — I10 Essential (primary) hypertension: Secondary | ICD-10-CM | POA: Diagnosis not present

## 2018-04-05 DIAGNOSIS — N183 Chronic kidney disease, stage 3 (moderate): Secondary | ICD-10-CM | POA: Diagnosis not present

## 2018-04-05 DIAGNOSIS — R05 Cough: Secondary | ICD-10-CM | POA: Diagnosis not present

## 2018-04-08 DIAGNOSIS — E1165 Type 2 diabetes mellitus with hyperglycemia: Secondary | ICD-10-CM | POA: Diagnosis not present

## 2018-04-08 DIAGNOSIS — Z789 Other specified health status: Secondary | ICD-10-CM | POA: Diagnosis not present

## 2018-04-08 DIAGNOSIS — I10 Essential (primary) hypertension: Secondary | ICD-10-CM | POA: Diagnosis not present

## 2018-04-08 DIAGNOSIS — M1710 Unilateral primary osteoarthritis, unspecified knee: Secondary | ICD-10-CM | POA: Diagnosis not present

## 2018-04-09 DIAGNOSIS — M1711 Unilateral primary osteoarthritis, right knee: Secondary | ICD-10-CM | POA: Diagnosis not present

## 2018-04-10 DIAGNOSIS — M1711 Unilateral primary osteoarthritis, right knee: Secondary | ICD-10-CM | POA: Diagnosis not present

## 2018-04-11 DIAGNOSIS — M1711 Unilateral primary osteoarthritis, right knee: Secondary | ICD-10-CM | POA: Diagnosis not present

## 2018-04-11 DIAGNOSIS — M79661 Pain in right lower leg: Secondary | ICD-10-CM | POA: Diagnosis not present

## 2018-04-12 DIAGNOSIS — D649 Anemia, unspecified: Secondary | ICD-10-CM | POA: Diagnosis not present

## 2018-04-12 DIAGNOSIS — M1711 Unilateral primary osteoarthritis, right knee: Secondary | ICD-10-CM | POA: Diagnosis not present

## 2018-04-12 DIAGNOSIS — I10 Essential (primary) hypertension: Secondary | ICD-10-CM | POA: Diagnosis not present

## 2018-04-12 DIAGNOSIS — Z471 Aftercare following joint replacement surgery: Secondary | ICD-10-CM | POA: Diagnosis not present

## 2018-04-12 DIAGNOSIS — M810 Age-related osteoporosis without current pathological fracture: Secondary | ICD-10-CM | POA: Diagnosis not present

## 2018-04-12 DIAGNOSIS — Z96651 Presence of right artificial knee joint: Secondary | ICD-10-CM | POA: Diagnosis not present

## 2018-04-12 DIAGNOSIS — G8918 Other acute postprocedural pain: Secondary | ICD-10-CM | POA: Diagnosis not present

## 2018-04-12 DIAGNOSIS — R262 Difficulty in walking, not elsewhere classified: Secondary | ICD-10-CM | POA: Diagnosis not present

## 2018-04-16 DIAGNOSIS — Z96651 Presence of right artificial knee joint: Secondary | ICD-10-CM | POA: Diagnosis not present

## 2018-04-16 DIAGNOSIS — R262 Difficulty in walking, not elsewhere classified: Secondary | ICD-10-CM | POA: Diagnosis not present

## 2018-04-16 DIAGNOSIS — D649 Anemia, unspecified: Secondary | ICD-10-CM | POA: Diagnosis not present

## 2018-04-16 DIAGNOSIS — G8918 Other acute postprocedural pain: Secondary | ICD-10-CM | POA: Diagnosis not present

## 2018-04-25 DIAGNOSIS — Z96651 Presence of right artificial knee joint: Secondary | ICD-10-CM | POA: Diagnosis not present

## 2018-04-25 DIAGNOSIS — Z471 Aftercare following joint replacement surgery: Secondary | ICD-10-CM | POA: Diagnosis not present

## 2018-04-28 DIAGNOSIS — I10 Essential (primary) hypertension: Secondary | ICD-10-CM | POA: Diagnosis not present

## 2018-04-28 DIAGNOSIS — E785 Hyperlipidemia, unspecified: Secondary | ICD-10-CM | POA: Diagnosis not present

## 2018-04-28 DIAGNOSIS — Z96651 Presence of right artificial knee joint: Secondary | ICD-10-CM | POA: Diagnosis not present

## 2018-04-28 DIAGNOSIS — F339 Major depressive disorder, recurrent, unspecified: Secondary | ICD-10-CM | POA: Diagnosis not present

## 2018-04-28 DIAGNOSIS — Z471 Aftercare following joint replacement surgery: Secondary | ICD-10-CM | POA: Diagnosis not present

## 2018-04-30 DIAGNOSIS — Z96651 Presence of right artificial knee joint: Secondary | ICD-10-CM | POA: Diagnosis not present

## 2018-04-30 DIAGNOSIS — F339 Major depressive disorder, recurrent, unspecified: Secondary | ICD-10-CM | POA: Diagnosis not present

## 2018-04-30 DIAGNOSIS — Z471 Aftercare following joint replacement surgery: Secondary | ICD-10-CM | POA: Diagnosis not present

## 2018-04-30 DIAGNOSIS — I10 Essential (primary) hypertension: Secondary | ICD-10-CM | POA: Diagnosis not present

## 2018-04-30 DIAGNOSIS — E785 Hyperlipidemia, unspecified: Secondary | ICD-10-CM | POA: Diagnosis not present

## 2018-05-02 DIAGNOSIS — Z471 Aftercare following joint replacement surgery: Secondary | ICD-10-CM | POA: Diagnosis not present

## 2018-05-02 DIAGNOSIS — Z96651 Presence of right artificial knee joint: Secondary | ICD-10-CM | POA: Diagnosis not present

## 2018-05-02 DIAGNOSIS — F339 Major depressive disorder, recurrent, unspecified: Secondary | ICD-10-CM | POA: Diagnosis not present

## 2018-05-02 DIAGNOSIS — I10 Essential (primary) hypertension: Secondary | ICD-10-CM | POA: Diagnosis not present

## 2018-05-02 DIAGNOSIS — E785 Hyperlipidemia, unspecified: Secondary | ICD-10-CM | POA: Diagnosis not present

## 2018-05-03 DIAGNOSIS — Z96651 Presence of right artificial knee joint: Secondary | ICD-10-CM | POA: Diagnosis not present

## 2018-05-03 DIAGNOSIS — Z471 Aftercare following joint replacement surgery: Secondary | ICD-10-CM | POA: Diagnosis not present

## 2018-05-03 DIAGNOSIS — F339 Major depressive disorder, recurrent, unspecified: Secondary | ICD-10-CM | POA: Diagnosis not present

## 2018-05-03 DIAGNOSIS — I10 Essential (primary) hypertension: Secondary | ICD-10-CM | POA: Diagnosis not present

## 2018-05-03 DIAGNOSIS — E785 Hyperlipidemia, unspecified: Secondary | ICD-10-CM | POA: Diagnosis not present

## 2018-05-07 DIAGNOSIS — E785 Hyperlipidemia, unspecified: Secondary | ICD-10-CM | POA: Diagnosis not present

## 2018-05-07 DIAGNOSIS — F339 Major depressive disorder, recurrent, unspecified: Secondary | ICD-10-CM | POA: Diagnosis not present

## 2018-05-07 DIAGNOSIS — Z471 Aftercare following joint replacement surgery: Secondary | ICD-10-CM | POA: Diagnosis not present

## 2018-05-07 DIAGNOSIS — Z96651 Presence of right artificial knee joint: Secondary | ICD-10-CM | POA: Diagnosis not present

## 2018-05-07 DIAGNOSIS — I10 Essential (primary) hypertension: Secondary | ICD-10-CM | POA: Diagnosis not present

## 2018-05-09 DIAGNOSIS — E785 Hyperlipidemia, unspecified: Secondary | ICD-10-CM | POA: Diagnosis not present

## 2018-05-09 DIAGNOSIS — F339 Major depressive disorder, recurrent, unspecified: Secondary | ICD-10-CM | POA: Diagnosis not present

## 2018-05-09 DIAGNOSIS — I10 Essential (primary) hypertension: Secondary | ICD-10-CM | POA: Diagnosis not present

## 2018-05-09 DIAGNOSIS — Z96651 Presence of right artificial knee joint: Secondary | ICD-10-CM | POA: Diagnosis not present

## 2018-05-09 DIAGNOSIS — Z471 Aftercare following joint replacement surgery: Secondary | ICD-10-CM | POA: Diagnosis not present

## 2018-05-10 DIAGNOSIS — Z471 Aftercare following joint replacement surgery: Secondary | ICD-10-CM | POA: Diagnosis not present

## 2018-05-10 DIAGNOSIS — E785 Hyperlipidemia, unspecified: Secondary | ICD-10-CM | POA: Diagnosis not present

## 2018-05-10 DIAGNOSIS — I10 Essential (primary) hypertension: Secondary | ICD-10-CM | POA: Diagnosis not present

## 2018-05-10 DIAGNOSIS — Z96651 Presence of right artificial knee joint: Secondary | ICD-10-CM | POA: Diagnosis not present

## 2018-05-10 DIAGNOSIS — F339 Major depressive disorder, recurrent, unspecified: Secondary | ICD-10-CM | POA: Diagnosis not present

## 2018-05-14 DIAGNOSIS — E785 Hyperlipidemia, unspecified: Secondary | ICD-10-CM | POA: Diagnosis not present

## 2018-05-14 DIAGNOSIS — Z96651 Presence of right artificial knee joint: Secondary | ICD-10-CM | POA: Diagnosis not present

## 2018-05-14 DIAGNOSIS — I10 Essential (primary) hypertension: Secondary | ICD-10-CM | POA: Diagnosis not present

## 2018-05-14 DIAGNOSIS — Z471 Aftercare following joint replacement surgery: Secondary | ICD-10-CM | POA: Diagnosis not present

## 2018-05-14 DIAGNOSIS — F339 Major depressive disorder, recurrent, unspecified: Secondary | ICD-10-CM | POA: Diagnosis not present

## 2018-05-16 DIAGNOSIS — I10 Essential (primary) hypertension: Secondary | ICD-10-CM | POA: Diagnosis not present

## 2018-05-16 DIAGNOSIS — F339 Major depressive disorder, recurrent, unspecified: Secondary | ICD-10-CM | POA: Diagnosis not present

## 2018-05-16 DIAGNOSIS — Z471 Aftercare following joint replacement surgery: Secondary | ICD-10-CM | POA: Diagnosis not present

## 2018-05-16 DIAGNOSIS — E785 Hyperlipidemia, unspecified: Secondary | ICD-10-CM | POA: Diagnosis not present

## 2018-05-16 DIAGNOSIS — Z96651 Presence of right artificial knee joint: Secondary | ICD-10-CM | POA: Diagnosis not present

## 2018-05-17 DIAGNOSIS — E785 Hyperlipidemia, unspecified: Secondary | ICD-10-CM | POA: Diagnosis not present

## 2018-05-17 DIAGNOSIS — F339 Major depressive disorder, recurrent, unspecified: Secondary | ICD-10-CM | POA: Diagnosis not present

## 2018-05-17 DIAGNOSIS — I10 Essential (primary) hypertension: Secondary | ICD-10-CM | POA: Diagnosis not present

## 2018-05-17 DIAGNOSIS — Z96651 Presence of right artificial knee joint: Secondary | ICD-10-CM | POA: Diagnosis not present

## 2018-05-17 DIAGNOSIS — Z471 Aftercare following joint replacement surgery: Secondary | ICD-10-CM | POA: Diagnosis not present

## 2018-05-21 DIAGNOSIS — Z471 Aftercare following joint replacement surgery: Secondary | ICD-10-CM | POA: Diagnosis not present

## 2018-05-21 DIAGNOSIS — I10 Essential (primary) hypertension: Secondary | ICD-10-CM | POA: Diagnosis not present

## 2018-05-21 DIAGNOSIS — E785 Hyperlipidemia, unspecified: Secondary | ICD-10-CM | POA: Diagnosis not present

## 2018-05-21 DIAGNOSIS — F339 Major depressive disorder, recurrent, unspecified: Secondary | ICD-10-CM | POA: Diagnosis not present

## 2018-05-21 DIAGNOSIS — Z96651 Presence of right artificial knee joint: Secondary | ICD-10-CM | POA: Diagnosis not present

## 2018-05-23 DIAGNOSIS — F339 Major depressive disorder, recurrent, unspecified: Secondary | ICD-10-CM | POA: Diagnosis not present

## 2018-05-23 DIAGNOSIS — I10 Essential (primary) hypertension: Secondary | ICD-10-CM | POA: Diagnosis not present

## 2018-05-23 DIAGNOSIS — Z96651 Presence of right artificial knee joint: Secondary | ICD-10-CM | POA: Diagnosis not present

## 2018-05-23 DIAGNOSIS — E785 Hyperlipidemia, unspecified: Secondary | ICD-10-CM | POA: Diagnosis not present

## 2018-05-23 DIAGNOSIS — Z471 Aftercare following joint replacement surgery: Secondary | ICD-10-CM | POA: Diagnosis not present

## 2018-06-07 DIAGNOSIS — I10 Essential (primary) hypertension: Secondary | ICD-10-CM | POA: Diagnosis not present

## 2018-06-07 DIAGNOSIS — Z789 Other specified health status: Secondary | ICD-10-CM | POA: Diagnosis not present

## 2018-06-07 DIAGNOSIS — E7800 Pure hypercholesterolemia, unspecified: Secondary | ICD-10-CM | POA: Diagnosis not present

## 2018-07-02 DIAGNOSIS — M7051 Other bursitis of knee, right knee: Secondary | ICD-10-CM | POA: Diagnosis not present

## 2018-07-04 DIAGNOSIS — E7800 Pure hypercholesterolemia, unspecified: Secondary | ICD-10-CM | POA: Diagnosis not present

## 2018-07-04 DIAGNOSIS — I10 Essential (primary) hypertension: Secondary | ICD-10-CM | POA: Diagnosis not present

## 2018-07-04 DIAGNOSIS — E559 Vitamin D deficiency, unspecified: Secondary | ICD-10-CM | POA: Diagnosis not present

## 2018-07-04 DIAGNOSIS — M7989 Other specified soft tissue disorders: Secondary | ICD-10-CM | POA: Diagnosis not present

## 2018-07-04 DIAGNOSIS — R5383 Other fatigue: Secondary | ICD-10-CM | POA: Diagnosis not present

## 2018-07-04 DIAGNOSIS — R0600 Dyspnea, unspecified: Secondary | ICD-10-CM | POA: Diagnosis not present

## 2018-07-04 DIAGNOSIS — M129 Arthropathy, unspecified: Secondary | ICD-10-CM | POA: Diagnosis not present

## 2018-07-04 DIAGNOSIS — D539 Nutritional anemia, unspecified: Secondary | ICD-10-CM | POA: Diagnosis not present

## 2018-07-05 DIAGNOSIS — M7989 Other specified soft tissue disorders: Secondary | ICD-10-CM | POA: Diagnosis not present

## 2018-07-11 DIAGNOSIS — N183 Chronic kidney disease, stage 3 (moderate): Secondary | ICD-10-CM | POA: Diagnosis not present

## 2018-07-11 DIAGNOSIS — I10 Essential (primary) hypertension: Secondary | ICD-10-CM | POA: Diagnosis not present

## 2018-07-11 DIAGNOSIS — E1165 Type 2 diabetes mellitus with hyperglycemia: Secondary | ICD-10-CM | POA: Diagnosis not present

## 2018-07-12 DIAGNOSIS — N183 Chronic kidney disease, stage 3 (moderate): Secondary | ICD-10-CM | POA: Diagnosis not present

## 2018-07-16 DIAGNOSIS — E7800 Pure hypercholesterolemia, unspecified: Secondary | ICD-10-CM | POA: Diagnosis not present

## 2018-07-16 DIAGNOSIS — I10 Essential (primary) hypertension: Secondary | ICD-10-CM | POA: Diagnosis not present

## 2018-07-16 DIAGNOSIS — Z789 Other specified health status: Secondary | ICD-10-CM | POA: Diagnosis not present

## 2018-07-25 DIAGNOSIS — N182 Chronic kidney disease, stage 2 (mild): Secondary | ICD-10-CM | POA: Diagnosis not present

## 2018-07-25 DIAGNOSIS — E1165 Type 2 diabetes mellitus with hyperglycemia: Secondary | ICD-10-CM | POA: Diagnosis not present

## 2018-07-25 DIAGNOSIS — I10 Essential (primary) hypertension: Secondary | ICD-10-CM | POA: Diagnosis not present

## 2018-07-25 DIAGNOSIS — J309 Allergic rhinitis, unspecified: Secondary | ICD-10-CM | POA: Diagnosis not present

## 2018-08-12 DIAGNOSIS — E1165 Type 2 diabetes mellitus with hyperglycemia: Secondary | ICD-10-CM | POA: Diagnosis not present

## 2018-08-12 DIAGNOSIS — I10 Essential (primary) hypertension: Secondary | ICD-10-CM | POA: Diagnosis not present

## 2018-08-12 DIAGNOSIS — Z789 Other specified health status: Secondary | ICD-10-CM | POA: Diagnosis not present

## 2018-08-15 DIAGNOSIS — Z96651 Presence of right artificial knee joint: Secondary | ICD-10-CM | POA: Diagnosis not present

## 2018-08-20 DIAGNOSIS — E1165 Type 2 diabetes mellitus with hyperglycemia: Secondary | ICD-10-CM | POA: Diagnosis not present

## 2018-08-20 DIAGNOSIS — Z23 Encounter for immunization: Secondary | ICD-10-CM | POA: Diagnosis not present

## 2018-08-20 DIAGNOSIS — M545 Low back pain: Secondary | ICD-10-CM | POA: Diagnosis not present

## 2018-08-20 DIAGNOSIS — R300 Dysuria: Secondary | ICD-10-CM | POA: Diagnosis not present

## 2018-09-19 DIAGNOSIS — E1165 Type 2 diabetes mellitus with hyperglycemia: Secondary | ICD-10-CM | POA: Diagnosis not present

## 2018-09-19 DIAGNOSIS — Z789 Other specified health status: Secondary | ICD-10-CM | POA: Diagnosis not present

## 2018-09-19 DIAGNOSIS — I10 Essential (primary) hypertension: Secondary | ICD-10-CM | POA: Diagnosis not present

## 2018-09-24 DIAGNOSIS — R51 Headache: Secondary | ICD-10-CM | POA: Diagnosis not present

## 2018-09-24 DIAGNOSIS — J309 Allergic rhinitis, unspecified: Secondary | ICD-10-CM | POA: Diagnosis not present

## 2018-09-24 DIAGNOSIS — R42 Dizziness and giddiness: Secondary | ICD-10-CM | POA: Diagnosis not present

## 2018-09-24 DIAGNOSIS — I10 Essential (primary) hypertension: Secondary | ICD-10-CM | POA: Diagnosis not present

## 2018-09-26 DIAGNOSIS — R51 Headache: Secondary | ICD-10-CM | POA: Diagnosis not present

## 2018-09-26 DIAGNOSIS — R42 Dizziness and giddiness: Secondary | ICD-10-CM | POA: Diagnosis not present

## 2018-10-01 DIAGNOSIS — R51 Headache: Secondary | ICD-10-CM | POA: Diagnosis not present

## 2018-10-01 DIAGNOSIS — J209 Acute bronchitis, unspecified: Secondary | ICD-10-CM | POA: Diagnosis not present

## 2018-10-01 DIAGNOSIS — R05 Cough: Secondary | ICD-10-CM | POA: Diagnosis not present

## 2018-10-04 DIAGNOSIS — Z0000 Encounter for general adult medical examination without abnormal findings: Secondary | ICD-10-CM | POA: Diagnosis not present

## 2018-10-04 DIAGNOSIS — R5383 Other fatigue: Secondary | ICD-10-CM | POA: Diagnosis not present

## 2018-10-04 DIAGNOSIS — E7800 Pure hypercholesterolemia, unspecified: Secondary | ICD-10-CM | POA: Diagnosis not present

## 2018-10-04 DIAGNOSIS — E559 Vitamin D deficiency, unspecified: Secondary | ICD-10-CM | POA: Diagnosis not present

## 2018-10-04 DIAGNOSIS — Z79899 Other long term (current) drug therapy: Secondary | ICD-10-CM | POA: Diagnosis not present

## 2018-10-08 DIAGNOSIS — R0600 Dyspnea, unspecified: Secondary | ICD-10-CM | POA: Diagnosis not present

## 2018-10-08 DIAGNOSIS — M79604 Pain in right leg: Secondary | ICD-10-CM | POA: Diagnosis not present

## 2018-10-08 DIAGNOSIS — I6529 Occlusion and stenosis of unspecified carotid artery: Secondary | ICD-10-CM | POA: Diagnosis not present

## 2018-10-08 DIAGNOSIS — I358 Other nonrheumatic aortic valve disorders: Secondary | ICD-10-CM | POA: Diagnosis not present

## 2018-10-08 DIAGNOSIS — I351 Nonrheumatic aortic (valve) insufficiency: Secondary | ICD-10-CM | POA: Diagnosis not present

## 2018-10-08 DIAGNOSIS — M79605 Pain in left leg: Secondary | ICD-10-CM | POA: Diagnosis not present

## 2018-10-10 DIAGNOSIS — Z789 Other specified health status: Secondary | ICD-10-CM | POA: Diagnosis not present

## 2018-10-10 DIAGNOSIS — E1165 Type 2 diabetes mellitus with hyperglycemia: Secondary | ICD-10-CM | POA: Diagnosis not present

## 2018-10-10 DIAGNOSIS — I10 Essential (primary) hypertension: Secondary | ICD-10-CM | POA: Diagnosis not present

## 2018-11-18 DIAGNOSIS — E559 Vitamin D deficiency, unspecified: Secondary | ICD-10-CM | POA: Diagnosis not present

## 2018-11-18 DIAGNOSIS — I10 Essential (primary) hypertension: Secondary | ICD-10-CM | POA: Diagnosis not present

## 2018-11-18 DIAGNOSIS — Z789 Other specified health status: Secondary | ICD-10-CM | POA: Diagnosis not present

## 2018-11-25 DIAGNOSIS — J018 Other acute sinusitis: Secondary | ICD-10-CM | POA: Diagnosis not present

## 2018-11-25 DIAGNOSIS — R0602 Shortness of breath: Secondary | ICD-10-CM | POA: Diagnosis not present

## 2018-11-25 DIAGNOSIS — G43909 Migraine, unspecified, not intractable, without status migrainosus: Secondary | ICD-10-CM | POA: Diagnosis not present

## 2018-11-25 DIAGNOSIS — I10 Essential (primary) hypertension: Secondary | ICD-10-CM | POA: Diagnosis not present

## 2018-12-11 DIAGNOSIS — Z789 Other specified health status: Secondary | ICD-10-CM | POA: Diagnosis not present

## 2018-12-11 DIAGNOSIS — E1165 Type 2 diabetes mellitus with hyperglycemia: Secondary | ICD-10-CM | POA: Diagnosis not present

## 2018-12-11 DIAGNOSIS — I10 Essential (primary) hypertension: Secondary | ICD-10-CM | POA: Diagnosis not present

## 2019-01-07 DIAGNOSIS — I10 Essential (primary) hypertension: Secondary | ICD-10-CM | POA: Diagnosis not present

## 2019-01-07 DIAGNOSIS — G910 Communicating hydrocephalus: Secondary | ICD-10-CM | POA: Diagnosis not present

## 2019-01-07 DIAGNOSIS — D539 Nutritional anemia, unspecified: Secondary | ICD-10-CM | POA: Diagnosis not present

## 2019-01-07 DIAGNOSIS — R51 Headache: Secondary | ICD-10-CM | POA: Diagnosis not present

## 2019-01-07 DIAGNOSIS — R5383 Other fatigue: Secondary | ICD-10-CM | POA: Diagnosis not present

## 2019-01-07 DIAGNOSIS — R300 Dysuria: Secondary | ICD-10-CM | POA: Diagnosis not present

## 2019-01-07 DIAGNOSIS — E559 Vitamin D deficiency, unspecified: Secondary | ICD-10-CM | POA: Diagnosis not present

## 2019-01-07 DIAGNOSIS — E7800 Pure hypercholesterolemia, unspecified: Secondary | ICD-10-CM | POA: Diagnosis not present

## 2019-01-07 DIAGNOSIS — M129 Arthropathy, unspecified: Secondary | ICD-10-CM | POA: Diagnosis not present

## 2019-01-13 DIAGNOSIS — G911 Obstructive hydrocephalus: Secondary | ICD-10-CM | POA: Diagnosis not present

## 2019-01-20 DIAGNOSIS — I10 Essential (primary) hypertension: Secondary | ICD-10-CM | POA: Diagnosis not present

## 2019-01-20 DIAGNOSIS — E1165 Type 2 diabetes mellitus with hyperglycemia: Secondary | ICD-10-CM | POA: Diagnosis not present

## 2019-01-20 DIAGNOSIS — R51 Headache: Secondary | ICD-10-CM | POA: Diagnosis not present

## 2019-01-24 DIAGNOSIS — E559 Vitamin D deficiency, unspecified: Secondary | ICD-10-CM | POA: Diagnosis not present

## 2019-01-24 DIAGNOSIS — Z789 Other specified health status: Secondary | ICD-10-CM | POA: Diagnosis not present

## 2019-01-24 DIAGNOSIS — E1165 Type 2 diabetes mellitus with hyperglycemia: Secondary | ICD-10-CM | POA: Diagnosis not present

## 2019-02-19 DIAGNOSIS — M545 Low back pain: Secondary | ICD-10-CM | POA: Diagnosis not present

## 2019-02-19 DIAGNOSIS — E1165 Type 2 diabetes mellitus with hyperglycemia: Secondary | ICD-10-CM | POA: Diagnosis not present

## 2019-02-19 DIAGNOSIS — Z789 Other specified health status: Secondary | ICD-10-CM | POA: Diagnosis not present

## 2019-02-27 DIAGNOSIS — I10 Essential (primary) hypertension: Secondary | ICD-10-CM | POA: Diagnosis not present

## 2019-02-27 DIAGNOSIS — E7800 Pure hypercholesterolemia, unspecified: Secondary | ICD-10-CM | POA: Diagnosis not present

## 2019-02-27 DIAGNOSIS — Z780 Asymptomatic menopausal state: Secondary | ICD-10-CM | POA: Diagnosis not present

## 2019-02-27 DIAGNOSIS — E119 Type 2 diabetes mellitus without complications: Secondary | ICD-10-CM | POA: Diagnosis not present

## 2019-02-27 DIAGNOSIS — Z0000 Encounter for general adult medical examination without abnormal findings: Secondary | ICD-10-CM | POA: Diagnosis not present

## 2019-03-31 DIAGNOSIS — M25512 Pain in left shoulder: Secondary | ICD-10-CM | POA: Diagnosis not present

## 2019-03-31 DIAGNOSIS — R51 Headache: Secondary | ICD-10-CM | POA: Diagnosis not present
